# Patient Record
Sex: Male | Born: 1950 | Race: White | Hispanic: No | Marital: Married | Smoking: Never smoker
Health system: Southern US, Community
[De-identification: ages and names within clinical notes are randomized; demographics above are authoritative.]

## PROBLEM LIST (undated history)

## (undated) DIAGNOSIS — I10 Essential (primary) hypertension: Secondary | ICD-10-CM

---

## 2015-03-07 ENCOUNTER — Ambulatory Visit
Admission: EM | Admit: 2015-03-07 | Discharge: 2015-03-07 | Disposition: A | Discharge status: Home / Self Care | Payer: BLUE CROSS/BLUE SHIELD | Attending: Family Medicine | Admitting: Family Medicine

## 2015-03-07 DIAGNOSIS — L237 Allergic contact dermatitis due to plants, except food: Secondary | ICD-10-CM | POA: Diagnosis not present

## 2015-03-07 HISTORY — DX: Essential (primary) hypertension: I10

## 2015-03-07 MED ORDER — PREDNISONE 10 MG PO TABS: ORAL_TABLET | ORAL | Status: DC

## 2015-03-07 MED ORDER — TRIAMCINOLONE ACETONIDE 0.1 % EX CREA: 1.0000 "application " | TOPICAL_CREAM | Freq: Two times a day (BID) | CUTANEOUS | Status: DC

## 2015-03-07 NOTE — ED Provider Notes (Signed)
CSN: [id number]     Arrival date & time 03/07/15  1701 History   First MD Initiated Contact with Patient 03/07/15 1803     Chief Complaint  Patient presents with  . Rash   (Consider location/radiation/quality/duration/timing/severity/associated sxs/prior Treatment) HPI   This is a 64 year old male who was mowing his grass on Thursday. He states that he knows he has poison oak that is low hanging in order to get to the grass he gets in close contact with the line. On Sunday he developed a rash with small vesicles affecting his arms and one spot on his lower back. The rash is very itchy and is in patches. There are excoriations present. 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  Past Medical History  Diagnosis Date  . Hypertension    History reviewed. No pertinent past surgical history. History reviewed. No pertinent family history. History  Substance Use Topics  . Smoking status: Never Smoker   . Smokeless tobacco: Not on file  . Alcohol Use: Yes    Review of Systems  Skin: Positive for rash.  All other systems reviewed and are negative.   Allergies  Penicillins  Home Medications   Prior to Admission medications   Medication Sig Start Date End Date Taking? Authorizing Provider  lisinopril (PRINIVIL,ZESTRIL) 10 MG tablet Take 10 mg by mouth daily.   Yes Historical Provider, MD  predniSONE (DELTASONE) 10 MG tablet Take 60 mg for 3 days; then 40 mg for 3 days ; then 20 mg for 3 days ;and finally 10 mg for 3 days 03/07/15   Lutricia Feil, PA-C  triamcinolone cream (KENALOG) 0.1 % Apply 1 application topically 2 (two) times daily. 03/07/15   Chrissie Noa Tayona Sarnowski, PA-C   BP 133/99 mmHg  Pulse 66  Temp(Src) 97.7 F (36.5 C)  Resp 16  Ht 6' (1.829 m)  Wt 220 lb (99.791 kg)  BMI 29.83 kg/m2  SpO2 100% Physical Exam  Constitutional: He is oriented to person, place, and time. He appears well-developed and well-nourished.  HENT:  Head: Normocephalic and atraumatic.  Eyes:  Pupils are equal, round, and reactive to light.  Musculoskeletal: Normal range of motion.  Neurological: He is alert and oriented to person, place, and time. He has normal reflexes.  Skin: Skin is warm and dry. Rash noted.  Examination of his skin shows the upper arms to have several areas of erythematous confluent maculopapular rash with small vesicles present. This covers mostly his extensor surfaces with very little on the volar surfaces. There is one small area at the small of his back that is excoriated and a patch of erythematous ocular papular rash. He has no involvement of his lower extremities or of his face.  Psychiatric: He has a normal mood and affect. His behavior is normal. Judgment and thought content normal.    ED Course  Procedures (including critical care time) Labs Review Labs Reviewed - No data to display  Imaging Review No results found.   MDM   1. Contact dermatitis due to poison oak    New Prescriptions   PREDNISONE (DELTASONE) 10 MG TABLET    Take 60 mg for 3 days; then 40 mg for 3 days ; then 20 mg for 3 days ;and finally 10 mg for 3 days   TRIAMCINOLONE CREAM (KENALOG) 0.1 %    Apply 1 application topically 2 (two) times daily.   Plan: 1. Test/x-ray results and diagnosis reviewed with patient 2. rx as  per orders; risks, benefits, potential side effects reviewed with patient 3. Recommend supportive treatment with medication 4. F/u prn if symptoms worsen or don't improve      Lutricia FeilWilliam P Marchello Rothgeb, PA-C 03/07/15 1840

## 2015-03-07 NOTE — ED Notes (Signed)
Pt states "rash on left arm, lower back, and today noticed between my right fingers. I think it may be poison oak."

## 2015-03-07 NOTE — Discharge Instructions (Signed)

## 2015-07-13 ENCOUNTER — Emergency Department: Payer: BLUE CROSS/BLUE SHIELD

## 2015-07-13 ENCOUNTER — Emergency Department
Admission: EM | Admit: 2015-07-13 | Discharge: 2015-07-13 | Disposition: A | Discharge status: Home / Self Care | Payer: BLUE CROSS/BLUE SHIELD | Attending: Student | Admitting: Student

## 2015-07-13 ENCOUNTER — Encounter: Payer: Self-pay | Admitting: Emergency Medicine

## 2015-07-13 DIAGNOSIS — Z880 Allergy status to penicillin: Secondary | ICD-10-CM | POA: Insufficient documentation

## 2015-07-13 DIAGNOSIS — F1410 Cocaine abuse, uncomplicated: Secondary | ICD-10-CM | POA: Insufficient documentation

## 2015-07-13 DIAGNOSIS — I10 Essential (primary) hypertension: Secondary | ICD-10-CM | POA: Insufficient documentation

## 2015-07-13 LAB — COMPREHENSIVE METABOLIC PANEL
ALT: 26 U/L (ref 17–63)
AST: 29 U/L (ref 15–41)
Albumin: 4.5 g/dL (ref 3.5–5.0)
Alkaline Phosphatase: 43 U/L (ref 38–126)
Anion gap: 11 (ref 5–15)
BUN: 11 mg/dL (ref 6–20)
CHLORIDE: 100 mmol/L — AB (ref 101–111)
CO2: 22 mmol/L (ref 22–32)
CREATININE: 0.91 mg/dL (ref 0.61–1.24)
Calcium: 9.4 mg/dL (ref 8.9–10.3)
GFR calc non Af Amer: >60 mL/min (ref >60)
GLUCOSE: 152 mg/dL — AB (ref 65–99)
Potassium: 4 mmol/L (ref 3.5–5.1)
SODIUM: 133 mmol/L — AB (ref 135–145)
Total Bilirubin: 0.4 mg/dL (ref 0.3–1.2)
Total Protein: 7.3 g/dL (ref 6.5–8.1)

## 2015-07-13 LAB — CBC
HEMATOCRIT: 42.5 % (ref 40.0–52.0)
HEMOGLOBIN: 14.7 g/dL (ref 13.0–18.0)
MCH: 31.5 pg (ref 26.0–34.0)
MCHC: 34.7 g/dL (ref 32.0–36.0)
MCV: 90.9 fL (ref 80.0–100.0)
Platelets: 189 10*3/uL (ref 150–440)
RBC: 4.67 MIL/uL (ref 4.40–5.90)
RDW: 12.6 % (ref 11.5–14.5)
WBC: 8 10*3/uL (ref 3.8–10.6)

## 2015-07-13 LAB — URINE DRUG SCREEN, QUALITATIVE (ARMC ONLY)
Amphetamines, Ur Screen: NOT DETECTED
Barbiturates, Ur Screen: NOT DETECTED
Benzodiazepine, Ur Scrn: NOT DETECTED
Cannabinoid 50 Ng, Ur ~~LOC~~: NOT DETECTED
Cocaine Metabolite,Ur ~~LOC~~: POSITIVE — AB
MDMA (ECSTASY) UR SCREEN: NOT DETECTED
Methadone Scn, Ur: NOT DETECTED
OPIATE, UR SCREEN: NOT DETECTED
Phencyclidine (PCP) Ur S: NOT DETECTED
TRICYCLIC, UR SCREEN: NOT DETECTED

## 2015-07-13 LAB — TROPONIN I
Troponin I: <0.03 ng/mL (ref <0.031)
Troponin I: <0.03 ng/mL (ref <0.031)

## 2015-07-13 LAB — ETHANOL: Alcohol, Ethyl (B): 5 mg/dL — ABNORMAL HIGH (ref <5)

## 2015-07-13 MED ORDER — DIAZEPAM 5 MG PO TABS
5.0000 mg | ORAL_TABLET | Freq: Once | ORAL | Status: AC
  Administered 2015-07-13: 5 mg via ORAL
  Filled 2015-07-13: qty 1

## 2015-07-13 MED ORDER — SODIUM CHLORIDE 0.9 % IV SOLN
Freq: Once | INTRAVENOUS | Status: AC
  Administered 2015-07-13: 21:00:00 via INTRAVENOUS

## 2015-07-13 MED ORDER — DIAZEPAM 5 MG/ML IJ SOLN
5.0000 mg | Freq: Once | INTRAMUSCULAR | Status: AC
  Administered 2015-07-13: 5 mg via INTRAVENOUS
  Filled 2015-07-13: qty 2

## 2015-07-13 NOTE — ED Provider Notes (Addendum)
[facility] Emergency Department Provider Note     Time seen: ----------------------------------------- 8:42 PM on 07/13/2015 -----------------------------------------    I have reviewed the triage vital signs and the nursing notes.   HISTORY  Chief Complaint Hypertension and Ingestion    HPI Eric Holland is a 64 y.o. male who presents ER from home. Patient states he tried what he thought was cocaine for the first time today around [birth year]1929. Patient states about an hour and a half ago he started being flushed and having head pressure. He still complaining of same, he denies shortness of breath or chest pain. Patient states he does want feel this way.   Past Medical History  Diagnosis Date  . Hypertension     There are no active problems to display for this patient.   No past surgical history on file.  Allergies Penicillins  Social History Social History  Substance Use Topics  . Smoking status: Never Smoker   . Smokeless tobacco: Not on file  . Alcohol Use: Yes    Review of Systems Constitutional: Negative for fever. Eyes: Negative for visual changes. ENT: Negative for sore throat. Cardiovascular: Negative for chest pain. Respiratory: Negative for shortness of breath. Gastrointestinal: Negative for abdominal pain, vomiting and diarrhea. Genitourinary: Negative for dysuria. Musculoskeletal: Negative for back pain. Skin: Positive for skin flushing Neurological: Positive for head pressure, negative for focal weakness or numbness.  10-point ROS otherwise negative.  ____________________________________________   PHYSICAL EXAM:  VITAL SIGNS: ED Triage Vitals  Enc Vitals Group     BP --      Pulse --      Resp --      Temp --      Temp src --      SpO2 --      Weight --      Height --      Head Cir --      Peak Flow --      Pain Score --      Pain Loc --      Pain Edu? --      Excl. in GC? --     Constitutional:  Alert and oriented. Anxious, flushed appearance Eyes: Conjunctivae are normal. PERRL. Normal extraocular movements. ENT   Head: Normocephalic and atraumatic.   Nose: No congestion/rhinnorhea.   Mouth/Throat: Mucous membranes are moist.   Neck: No stridor. Cardiovascular: Normal rate, regular rhythm. Normal and symmetric distal pulses are present in all extremities. No murmurs, rubs, or gallops. Respiratory: Mild tachypnea, Breath sounds are clear and equal bilaterally. No wheezes/rales/rhonchi. Gastrointestinal: Soft and nontender. No distention. No abdominal bruits.  Musculoskeletal: Nontender with normal range of motion in all extremities. No joint effusions.  No lower extremity tenderness nor edema. Neurologic:  Normal speech and language. No gross focal neurologic deficits are appreciated. Speech is normal. No gait instability. Skin:  Skin erythema across his chest neck and face. No hives or rash. Psychiatric: Mood and affect are normal. Speech and behavior are normal. Patient exhibits appropriate insight and judgment. ____________________________________________  EKG: Interpreted by me. Sinus tachycardia with rate of 109 bpm, incomplete right bundle branch block, normal axis. No evidence of acute infarction.  ____________________________________________  ED COURSE:  Pertinent labs & imaging results that were available during my care of the patient were reviewed by me and considered in my medical decision making (see chart for details). Patient receive IV fluids, Valium. We'll check labs and drug screen. ____________________________________________    LABS (pertinent  positives/negatives)  Labs Reviewed  COMPREHENSIVE METABOLIC PANEL - Abnormal; Notable for the following:    Sodium 133 (*)    Chloride 100 (*)    Glucose, Bld 152 (*)    All other components within normal limits  ETHANOL - Abnormal; Notable for the following:    Alcohol, Ethyl (B) 5 (*)    All other  components within normal limits  URINE DRUG SCREEN, QUALITATIVE ([facility] ONLY) - Abnormal; Notable for the following:    Cocaine Metabolite,Ur [place] POSITIVE (*)    All other components within normal limits  CBC  TROPONIN I  TROPONIN I    RADIOLOGY Images were viewed by me  Chest x-ray Is unremarkable ____________________________________________  FINAL ASSESSMENT AND PLAN  Cocaine abuse  Plan: Patient with labs and imaging as dictated above. Patient is feeling better after fluids and Valium. His labs to this point been unremarkable. Troponin was checked 2 and was negative. He stable for outpatient follow-up.   Emily Filbert, MD   Emily Filbert, MD 07/13/15 [id number]  Emily Filbert, MD 07/13/15 [phone]

## 2015-07-13 NOTE — ED Notes (Signed)
Ice water provided to patient

## 2015-07-13 NOTE — ED Notes (Signed)
MD at bedside for reeval

## 2015-07-13 NOTE — Discharge Instructions (Signed)
Stimulant Use Disorder-Cocaine  Cocaine is one of a group of powerful drugs called stimulants. Cocaine has medical uses for stopping nosebleeds and for pain control before minor nose or dental surgery. However, cocaine is misused because of the effects that it produces. These effects include:   · A feeling of extreme pleasure.  · Alertness.  · High energy.  Common street names for cocaine include coke, crack, blow, snow, and nose candy. Cocaine is snorted, dissolved in water and injected, or smoked.   Stimulants are addictive because they activate regions of the brain that produce both the pleasurable sensation of "reward" and psychological dependence. Together, these actions account for loss of control and the rapid development of drug dependence. This means you become ill without the drug (withdrawal) and need to keep using it to function.   Stimulant use disorder is use of stimulants that disrupts your daily life. It disrupts relationships with family and friends and how you do your job. Cocaine increases your blood pressure and heart rate. It can cause a heart attack or stroke. Cocaine can also cause death from irregular heart rate or seizures.  SYMPTOMS  Symptoms of stimulant use disorder with cocaine include:  · Use of cocaine in larger amounts or over a longer period of time than intended.  · Unsuccessful attempts to cut down or control cocaine use.  · A lot of time spent obtaining, using, or recovering from the effects of cocaine.  · A strong desire or urge to use cocaine (craving).  · Continued use of cocaine in spite of major problems at work, school, or home because of use.  · Continued use of cocaine in spite of relationship problems because of use.  · Giving up or cutting down on important life activities because of cocaine use.  · Use of cocaine over and over in situations when it is physically hazardous, such as driving a car.  · Continued use of cocaine in spite of a physical problem that is likely  related to use. Physical problems can include:  ¨ Malnutrition.  ¨ Nosebleeds.  ¨ Chest pain.  ¨ High blood pressure.  ¨ A hole that develops between the part of your nose that separates your nostrils (perforated nasal septum).  ¨ Lung and kidney damage.  · Continued use of cocaine in spite of a mental problem that is likely related to use. Mental problems can include:  ¨ Schizophrenia-like symptoms.  ¨ Depression.  ¨ Bipolar mood swings.  ¨ Anxiety.  ¨ Sleep problems.  · Need to use more and more cocaine to get the same effect, or lessened effect over time with use of the same amount of cocaine (tolerance).  · Having withdrawal symptoms when cocaine use is stopped, or using cocaine to reduce or avoid withdrawal symptoms. Withdrawal symptoms include:  ¨ Depressed or irritable mood.  ¨ Low energy or restlessness.  ¨ Bad dreams.  ¨ Poor or excessive sleep.  ¨ Increased appetite.  DIAGNOSIS  Stimulant use disorder is diagnosed by your health care provider. You may be asked questions about your cocaine use and how it affects your life. A physical exam may be done. A drug screen may be ordered. You may be referred to a mental health professional. The diagnosis of stimulant use disorder requires at least two symptoms within 12 months. The type of stimulant use disorder depends on the number of signs and symptoms you have. The type may be:  · Mild. Two or three signs and symptoms.  ·   Moderate. Four or five signs and symptoms.  · Severe. Six or more signs and symptoms.  TREATMENT  Treatment for stimulant use disorder is usually provided by mental health professionals with training in substance use disorders. The following options are available:  · Counseling or talk therapy. Talk therapy addresses the reasons you use cocaine and ways to keep you from using again. Goals of talk therapy include:  ¨ Identifying and avoiding triggers for use.  ¨ Handling cravings.  ¨ Replacing use with healthy activities.  · Support groups.  Support groups provide emotional support, advice, and guidance.  · Medicine. Certain medicines may decrease cocaine cravings or withdrawal symptoms.  HOME CARE INSTRUCTIONS  · Take medicines only as directed by your health care provider.  · Identify the people and activities that trigger your cocaine use and avoid them.  · Keep all follow-up visits as directed by your health care provider.  SEEK MEDICAL CARE IF:  · Your symptoms get worse or you relapse.  · You are not able to take medicines as directed.  SEEK IMMEDIATE MEDICAL CARE IF:  · You have serious thoughts about hurting yourself or others.  · You have a seizure, chest pain, sudden weakness, or loss of speech or vision.  FOR MORE INFORMATION  · National Institute on Drug Abuse: www.drugabuse.gov  · Substance Abuse and Mental Health Services Administration: www.samhsa.gov     This information is not intended to replace advice given to you by your health care provider. Make sure you discuss any questions you have with your health care provider.     Document Released: 09/19/2000 Document Revised: 10/13/2014 Document Reviewed: 10/05/2013  Elsevier Interactive Patient Education ©2016 [employer]

## 2015-07-13 NOTE — ED Notes (Signed)
Pt arrives via EMS from home. Pt states, "I tried what I think was cocaine for the first time today around 4 or 4:30. About an hour and a half ago I started feeling flushed and had head pressure." Pt c/o "feeling flushed and head pressure" at this time. NAD noted. RR even and nonlabored. Pt AAOx4. Denies SOB or chest pain.

## 2017-10-16 ENCOUNTER — Other Ambulatory Visit: Payer: Self-pay

## 2017-10-16 ENCOUNTER — Encounter: Payer: Self-pay | Admitting: Emergency Medicine

## 2017-10-16 ENCOUNTER — Ambulatory Visit
Admission: EM | Admit: 2017-10-16 | Discharge: 2017-10-16 | Disposition: A | Discharge status: Home / Self Care | Payer: BLUE CROSS/BLUE SHIELD | Attending: Family Medicine | Admitting: Family Medicine

## 2017-10-16 DIAGNOSIS — J069 Acute upper respiratory infection, unspecified: Secondary | ICD-10-CM

## 2017-10-16 DIAGNOSIS — R05 Cough: Secondary | ICD-10-CM

## 2017-10-16 MED ORDER — BENZONATATE 200 MG PO CAPS: ORAL_CAPSULE | ORAL | 0 refills | Status: DC

## 2017-10-16 MED ORDER — HYDROCOD POLST-CPM POLST ER 10-8 MG/5ML PO SUER: 5.0000 mL | Freq: Two times a day (BID) | ORAL | 0 refills | Status: DC

## 2017-10-16 MED ORDER — FLUTICASONE PROPIONATE 50 MCG/ACT NA SUSP: 2.0000 | Freq: Every day | NASAL | 0 refills | Status: DC

## 2017-10-16 NOTE — ED Provider Notes (Signed)
[facility]    CSN: [id number] Arrival date & time: 10/16/17  0844     History   Chief Complaint Chief Complaint  Patient presents with  . Cough    HPI Eric Holland is a 67 y.o. male.   HPI 67 year old male who presents with cough chest congestion sinus congestion and runny nose since Monday 4 days prior to this visit.  He is afebrile.  He denies any fevers.  He has had upper nasal congestion that worsened with recumbency and then developed a cough which is nonproductive.  He denies any shortness of breath.  O2 sats are 99% on room air.  He was feeling better yesterday and thought that he was over the illness but only returned today worse.      Past Medical History:  Diagnosis Date  . Hypertension     There are no active problems to display for this patient.   History reviewed. No pertinent surgical history.     Home Medications    Prior to Admission medications   Medication Sig Start Date End Date Taking? Authorizing Provider  aspirin EC 81 MG tablet Take 81 mg by mouth daily.   Yes [provider]  lisinopril (PRINIVIL,ZESTRIL) 10 MG tablet Take 10 mg by mouth daily.   Yes [provider]  benzonatate (TESSALON) 200 MG capsule Take one cap TID PRN cough 10/16/17   Lutricia Feil, PA-C  chlorpheniramine-HYDROcodone [facility]) 10-8 MG/5ML SUER Take 5 mLs by mouth 2 (two) times daily. 10/16/17   Lutricia Feil, PA-C  fluticasone (FLONASE) 50 MCG/ACT nasal spray Place 2 sprays into both nostrils daily. 10/16/17   Lutricia Feil, PA-C    Family History Family History  Problem Relation Age of Onset  . Heart failure Father     Social History Social History   Tobacco Use  . Smoking status: Never Smoker  . Smokeless tobacco: Never Used  Substance Use Topics  . Alcohol use: Yes    Comment: 3-4 times/week  . Drug use: No     Allergies   Penicillins   Review of Systems Review of Systems    Constitutional: Positive for activity change. Negative for chills, fatigue and fever.  HENT: Positive for congestion, postnasal drip, rhinorrhea and sinus pressure.   Respiratory: Positive for cough.   All other systems reviewed and are negative.    Physical Exam Triage Vital Signs ED Triage Vitals  Enc Vitals Group     BP 10/16/17 0900 (!) 150/99     Pulse Rate 10/16/17 0900 71     Resp 10/16/17 0900 16     Temp 10/16/17 0900 98.1 F (36.7 C)     Temp Source 10/16/17 0900 Oral     SpO2 10/16/17 0900 99 %     Weight 10/16/17 0856 225 lb (102.1 kg)     Height 10/16/17 0856 6' (1.829 m)     Head Circumference --      Peak Flow --      Pain Score 10/16/17 0857 3     Pain Loc --      Pain Edu? --      Excl. in GC? --    No data found.  Updated Vital Signs BP (!) 150/99 (BP Location: Left Arm)   Pulse 71   Temp 98.1 F (36.7 C) (Oral)   Resp 16   Ht 6' (1.829 m)   Wt 225 lb (102.1 kg)   SpO2 99%  BMI 30.52 kg/m   Visual Acuity Right Eye Distance:   Left Eye Distance:   Bilateral Distance:    Right Eye Near:   Left Eye Near:    Bilateral Near:     Physical Exam  Constitutional: He is oriented to person, place, and time. He appears well-developed and well-nourished. No distress.  HENT:  Head: Normocephalic.  Right Ear: External ear normal.  Left Ear: External ear normal.  Nose: Nose normal.  Mouth/Throat: Oropharynx is clear and moist. No oropharyngeal exudate.  Eyes: Pupils are equal, round, and reactive to light. Right eye exhibits no discharge. Left eye exhibits no discharge.  Neck: Normal range of motion.  Pulmonary/Chest: Effort normal and breath sounds normal.  Musculoskeletal: Normal range of motion.  Lymphadenopathy:    He has no cervical adenopathy.  Neurological: He is alert and oriented to person, place, and time.  Skin: Skin is warm and dry. He is not diaphoretic.  Psychiatric: He has a normal mood and affect. His behavior is normal. Judgment  and thought content normal.  Nursing note and vitals reviewed.    UC Treatments / Results  Labs (all labs ordered are listed, but only abnormal results are displayed) Labs Reviewed - No data to display  EKG  EKG Interpretation None       Radiology No results found.  Procedures Procedures (including critical care time)  Medications Ordered in UC Medications - No data to display   Initial Impression / Assessment and Plan / UC Course  I have reviewed the triage vital signs and the nursing notes.  Pertinent labs & imaging results that were available during my care of the patient were reviewed by me and considered in my medical decision making (see chart for details).     Plan: 1. Test/x-ray results and diagnosis reviewed with patient 2. rx as per orders; risks, benefits, potential side effects reviewed with patient 3. Recommend supportive treatment with rest and fluids.  May use Mucinex to help loosen your cough.  If you are not improving in a week return to our clinic or follow-up with your primary care physician for further evaluation 4. F/u prn if symptoms worsen or don't improve   Final Clinical Impressions(s) / UC Diagnoses   Final diagnoses:  Upper respiratory tract infection, unspecified type    ED Discharge Orders        Ordered    fluticasone (FLONASE) 50 MCG/ACT nasal spray  Daily     10/16/17 0918    benzonatate (TESSALON) 200 MG capsule     10/16/17 0918    chlorpheniramine-HYDROcodone (TUSSIONEX PENNKINETIC ER) 10-8 MG/5ML SUER  2 times daily     10/16/17 0918       Controlled Substance Prescriptions [place] Controlled Substance Registry consulted? Not Applicable   Lutricia FeilRoemer, Mikena Masoner P, PA-C 10/16/17 [id number]

## 2017-10-16 NOTE — ED Triage Notes (Signed)
Patient c/o cough, chest congestion, sinus congestion and runny nose since Monday.  Patient denies fevers.

## 2017-10-19 ENCOUNTER — Telehealth: Payer: Self-pay | Admitting: Emergency Medicine

## 2017-10-19 NOTE — Telephone Encounter (Signed)
Called to follow up with patient after his recent visit. Patient states he is improving and able to cough some mucous up.

## 2018-06-12 ENCOUNTER — Encounter: Payer: Self-pay | Admitting: Emergency Medicine

## 2018-06-12 ENCOUNTER — Emergency Department: Payer: BLUE CROSS/BLUE SHIELD

## 2018-06-12 ENCOUNTER — Emergency Department
Admission: EM | Admit: 2018-06-12 | Discharge: 2018-06-12 | Disposition: A | Discharge status: Home / Self Care | Payer: BLUE CROSS/BLUE SHIELD | Attending: Emergency Medicine | Admitting: Emergency Medicine

## 2018-06-12 ENCOUNTER — Other Ambulatory Visit: Payer: Self-pay

## 2018-06-12 DIAGNOSIS — I10 Essential (primary) hypertension: Secondary | ICD-10-CM | POA: Insufficient documentation

## 2018-06-12 DIAGNOSIS — R079 Chest pain, unspecified: Secondary | ICD-10-CM | POA: Diagnosis present

## 2018-06-12 DIAGNOSIS — Z7982 Long term (current) use of aspirin: Secondary | ICD-10-CM | POA: Diagnosis not present

## 2018-06-12 DIAGNOSIS — Z79899 Other long term (current) drug therapy: Secondary | ICD-10-CM | POA: Insufficient documentation

## 2018-06-12 DIAGNOSIS — K2920 Alcoholic gastritis without bleeding: Secondary | ICD-10-CM | POA: Diagnosis not present

## 2018-06-12 LAB — COMPREHENSIVE METABOLIC PANEL
ALT: 26 U/L (ref 0–44)
ANION GAP: 9 (ref 5–15)
AST: 23 U/L (ref 15–41)
Albumin: 4.1 g/dL (ref 3.5–5.0)
Alkaline Phosphatase: 39 U/L (ref 38–126)
BUN: 18 mg/dL (ref 8–23)
CHLORIDE: 102 mmol/L (ref 98–111)
CO2: 24 mmol/L (ref 22–32)
Calcium: 8.8 mg/dL — ABNORMAL LOW (ref 8.9–10.3)
Creatinine, Ser: 0.89 mg/dL (ref 0.61–1.24)
Glucose, Bld: 132 mg/dL — ABNORMAL HIGH (ref 70–99)
POTASSIUM: 4.4 mmol/L (ref 3.5–5.1)
Sodium: 135 mmol/L (ref 135–145)
Total Bilirubin: 1 mg/dL (ref 0.3–1.2)
Total Protein: 7.1 g/dL (ref 6.5–8.1)

## 2018-06-12 LAB — CBC
HCT: 40.2 % (ref 40.0–52.0)
Hemoglobin: 14.3 g/dL (ref 13.0–18.0)
MCH: 32.6 pg (ref 26.0–34.0)
MCHC: 35.6 g/dL (ref 32.0–36.0)
MCV: 91.4 fL (ref 80.0–100.0)
Platelets: 190 10*3/uL (ref 150–440)
RBC: 4.4 MIL/uL (ref 4.40–5.90)
RDW: 12.6 % (ref 11.5–14.5)
WBC: 6.2 10*3/uL (ref 3.8–10.6)

## 2018-06-12 LAB — TROPONIN I

## 2018-06-12 LAB — LIPASE, BLOOD: LIPASE: 22 U/L (ref 11–51)

## 2018-06-12 MED ORDER — NITROGLYCERIN 0.4 MG SL SUBL
0.4000 mg | SUBLINGUAL_TABLET | SUBLINGUAL | Status: DC | PRN
  Administered 2018-06-12: 0.4 mg via SUBLINGUAL
  Filled 2018-06-12: qty 1

## 2018-06-12 MED ORDER — FAMOTIDINE 20 MG PO TABS: 20.0000 mg | ORAL_TABLET | Freq: Two times a day (BID) | ORAL | 1 refills | Status: AC

## 2018-06-12 MED ORDER — FAMOTIDINE IN NACL 20-0.9 MG/50ML-% IV SOLN
20.0000 mg | Freq: Once | INTRAVENOUS | Status: AC
  Administered 2018-06-12: 20 mg via INTRAVENOUS
  Filled 2018-06-12: qty 50

## 2018-06-12 MED ORDER — ONDANSETRON HCL 4 MG/2ML IJ SOLN
4.0000 mg | Freq: Once | INTRAMUSCULAR | Status: AC
  Administered 2018-06-12: 4 mg via INTRAVENOUS
  Filled 2018-06-12: qty 2

## 2018-06-12 MED ORDER — SODIUM CHLORIDE 0.9 % IV BOLUS
1000.0000 mL | Freq: Once | INTRAVENOUS | Status: AC
  Administered 2018-06-12: 1000 mL via INTRAVENOUS

## 2018-06-12 MED ORDER — ASPIRIN 81 MG PO CHEW
324.0000 mg | CHEWABLE_TABLET | Freq: Once | ORAL | Status: AC
  Administered 2018-06-12: 324 mg via ORAL
  Filled 2018-06-12: qty 4

## 2018-06-12 NOTE — ED Triage Notes (Signed)
Pt reports woke up in middle of night with what he thought was indigestion.  Hx of indigestion but normally resolves when "passing gas".  Pt has a discomfort or indigestion like feeling in chest that will not go away. Has been feeling lightheaded and dizzy.  Has been nauseated as well.

## 2018-06-12 NOTE — Discharge Instructions (Addendum)

## 2018-06-12 NOTE — ED Provider Notes (Signed)
[facility] Emergency Department Provider Note  ____________________________________________  Time seen: Approximately 9:09 AM  I have reviewed the triage vital signs and the nursing notes.   HISTORY  Chief Complaint Chest Pain   HPI Eric Holland is a 67 y.o. male with a history of hypertension and GERD who presents for evaluation of chest tightness.  Patient reports that he usually wakes up in the morning feeling mild discomfort in his chest which usually resolves after he passes gas.  Today the symptoms have been constant since 3 AM.  He is complaining of tightness in the center of his chest which has been constant and mild/moderate in intensity, associated with nausea and lightheadedness.  The tightness does not radiate to his back.  Patient reports having 8 alcoholic beverages last night.  He denies any drug/cocaine use.  No history of smoking.  Patient has family history in father and grandfather heart attacks in their 52s.  Patient denies pleuritic chest pain, personal family history of blood clots, recent travel immobilization, leg pain or swelling or hemoptysis.  Past Medical History:  Diagnosis Date  . Hypertension     There are no active problems to display for this patient.   History reviewed. No pertinent surgical history.  Prior to Admission medications   Medication Sig Start Date End Date Taking? Authorizing Provider  aspirin EC 81 MG tablet Take 81 mg by mouth daily.   Yes [provider]  lisinopril (PRINIVIL,ZESTRIL) 10 MG tablet Take 10 mg by mouth daily.   Yes [provider]  Multiple Vitamin (MULTIVITAMIN WITH MINERALS) TABS tablet Take 1 tablet by mouth daily.   Yes [provider]  famotidine (PEPCID) 20 MG tablet Take 1 tablet (20 mg total) by mouth 2 (two) times daily. 06/12/18 06/12/19  Nita Sickle, MD    Allergies Penicillins  Family History  Problem Relation Age of Onset  . Heart failure  Father     Social History Social History   Tobacco Use  . Smoking status: Never Smoker  . Smokeless tobacco: Never Used  Substance Use Topics  . Alcohol use: Yes    Comment: 3-4 times/week  . Drug use: No    Types: Cocaine    Review of Systems  Constitutional: Negative for fever. + Lightheadedness Eyes: Negative for visual changes. ENT: Negative for sore throat. Neck: No neck pain  Cardiovascular: + chest tightness Respiratory: Negative for shortness of breath. Gastrointestinal: Negative for abdominal pain, vomiting or diarrhea.+ Nausea Genitourinary: Negative for dysuria. Musculoskeletal: Negative for back pain. Skin: Negative for rash. Neurological: Negative for headaches, weakness or numbness. Psych: No SI or HI  ____________________________________________   PHYSICAL EXAM:  VITAL SIGNS: ED Triage Vitals  Enc Vitals Group     BP 06/12/18 0840 (!) 157/105     Pulse Rate 06/12/18 0840 62     Resp 06/12/18 0840 16     Temp 06/12/18 0840 (!) 97.3 F (36.3 C)     Temp Source 06/12/18 0840 Oral     SpO2 06/12/18 0840 98 %     Weight 06/12/18 0834 230 lb (104.3 kg)     Height 06/12/18 0834 6' (1.829 m)     Head Circumference --      Peak Flow --      Pain Score 06/12/18 0833 7     Pain Loc --      Pain Edu? --      Excl. in [place]? --     Constitutional:  Alert and oriented. Well appearing and in no apparent distress. HEENT:      Head: Normocephalic and atraumatic.         Eyes: Conjunctivae are normal. Sclera is non-icteric.       Mouth/Throat: Mucous membranes are moist.       Neck: Supple with no signs of meningismus. Cardiovascular: Regular rate and rhythm. No murmurs, gallops, or rubs. 2+ symmetrical distal pulses are present in all extremities. No JVD. Respiratory: Normal respiratory effort. Lungs are clear to auscultation bilaterally. No wheezes, crackles, or rhonchi.  Gastrointestinal: Soft, tender to palpation epigastric region, and non distended with  positive bowel sounds. No rebound or guarding. Musculoskeletal: Nontender with normal range of motion in all extremities. No edema, cyanosis, or erythema of extremities. Neurologic: Normal speech and language. Face is symmetric. Moving all extremities. No gross focal neurologic deficits are appreciated. Skin: Skin is warm, dry and intact. No rash noted. Psychiatric: Mood and affect are normal. Speech and behavior are normal.  ____________________________________________   LABS (all labs ordered are listed, but only abnormal results are displayed)  Labs Reviewed  COMPREHENSIVE METABOLIC PANEL - Abnormal; Notable for the following components:      Result Value   Glucose, Bld 132 (*)    Calcium 8.8 (*)    All other components within normal limits  CBC  TROPONIN I  LIPASE, BLOOD  TROPONIN I  URINE DRUG SCREEN, QUALITATIVE ([facility] ONLY)   ____________________________________________  EKG  ED ECG REPORT I, Nita Sickle, the attending physician, personally viewed and interpreted this ECG.  08:37 -sinus bradycardia, rate of 59, right bundle branch block, normal QTC, normal axis, T wave inversion lead III, no ST elevations or depressions.  Unchanged from prior from 2016.  08:49 -sinus bradycardia, rate of 52, right bundle branch block, normal QTC, normal axis, T wave flattening in lead III, no ST elevations or depressions.  No significant changes from initial. ____________________________________________  RADIOLOGY  I have personally reviewed the images performed during this visit and I agree with the Radiologist's read.   Interpretation by Radiologist:  Dg Chest 2 View  Result Date: 06/12/2018 CLINICAL DATA:  67 year old male with a history of epigastric pain EXAM: CHEST - 2 VIEW COMPARISON:  07/13/2015 FINDINGS: Cardiomediastinal silhouette unchanged in size and contour. No evidence of central vascular congestion. No pneumothorax or pleural effusion. Low lung volumes.  Linear  interstitial opacities bilaterally. No displaced fracture. IMPRESSION: Chronic lung changes without evidence of acute cardiopulmonary disease Electronically Signed   By: Gilmer Mor D.O.   On: 06/12/2018 09:34      ____________________________________________   PROCEDURES  Procedure(s) performed: None Procedures Critical Care performed:  None ____________________________________________   INITIAL IMPRESSION / ASSESSMENT AND PLAN / ED COURSE   67 y.o. male with a history of hypertension and GERD who presents for evaluation of chest tightness associated with nausea and lightheadedness.  Patient has a history of daily drinking and had 8 alcoholic beverages yesterday.  He is well-appearing and in no distress, slightly elevated blood pressure but other than that his vital signs are within normal limits.  EKG x2 with no ischemic changes.  Differential diagnosis including ACS versus pancreatitis versus gastritis versus GERD versus peptic ulcer disease.  We will give nitro, fluids, Zofran, Pepcid, and aspirin.  We will check CBC, CMP, lipase, troponin and chest x-ray.  Will monitor patient carefully on telemetry. Clinical Course as of Jun 12 1350  Sat Jun 12, 2018  1348 Patient's pain resolved after  IV Pepcid.  Troponin x2 is negative.  Labs including CMP, lipase, CBC are all within normal limits.  Will give Pepcid for home.  Recommended follow-up with primary care doctor.  Discussed reduction alcohol consumption and the risks associated with daily alcohol use.  Recommended return to the emergency room for new or worsening chest pain, shortness of breath or abdominal pain.   [CV]    Clinical Course User Index [CV] Don Perking [place], MD     As part of my medical decision making, I reviewed the following data within the electronic MEDICAL RECORD NUMBER Nursing notes reviewed and incorporated, Labs reviewed , EKG interpreted , Old EKG reviewed, Old chart reviewed, Radiograph reviewed , Notes from  prior ED visits and [place] Controlled Substance Database    Pertinent labs & imaging results that were available during my care of the patient were reviewed by me and considered in my medical decision making (see chart for details).    ____________________________________________   FINAL CLINICAL IMPRESSION(S) / ED DIAGNOSES  Final diagnoses:  Chest pain, unspecified type  Acute alcoholic gastritis without hemorrhage      NEW MEDICATIONS STARTED DURING THIS VISIT:  ED Discharge Orders         Ordered    famotidine (PEPCID) 20 MG tablet  2 times daily     06/12/18 1351           Note:  This document was prepared using Dragon voice recognition software and may include unintentional dictation errors.    Don Perking, [place], MD 06/12/18 1351

## 2018-06-12 NOTE — ED Notes (Signed)
Patient transported to X-ray 

## 2018-06-12 NOTE — ED Notes (Addendum)
Pt c/o chest pain - describes pain as "indigestion and gas that wants to come out" - pt reports having excessive gas usually with indigestion but this am it increased in intensity - pt having mid epigastric pain with palpation - pt c/o weakness and "feeling like he is going to pass out - denies vomiting but c/o nausea

## 2019-07-20 IMAGING — CR DG CHEST 2V
2 series · 2 of 2 positions shown · non-contrast
Comparison: 07/13/2015

CLINICAL DATA: 66-year-old male with a history of epigastric pain

EXAM:
CHEST - 2 VIEW

[chest pa]
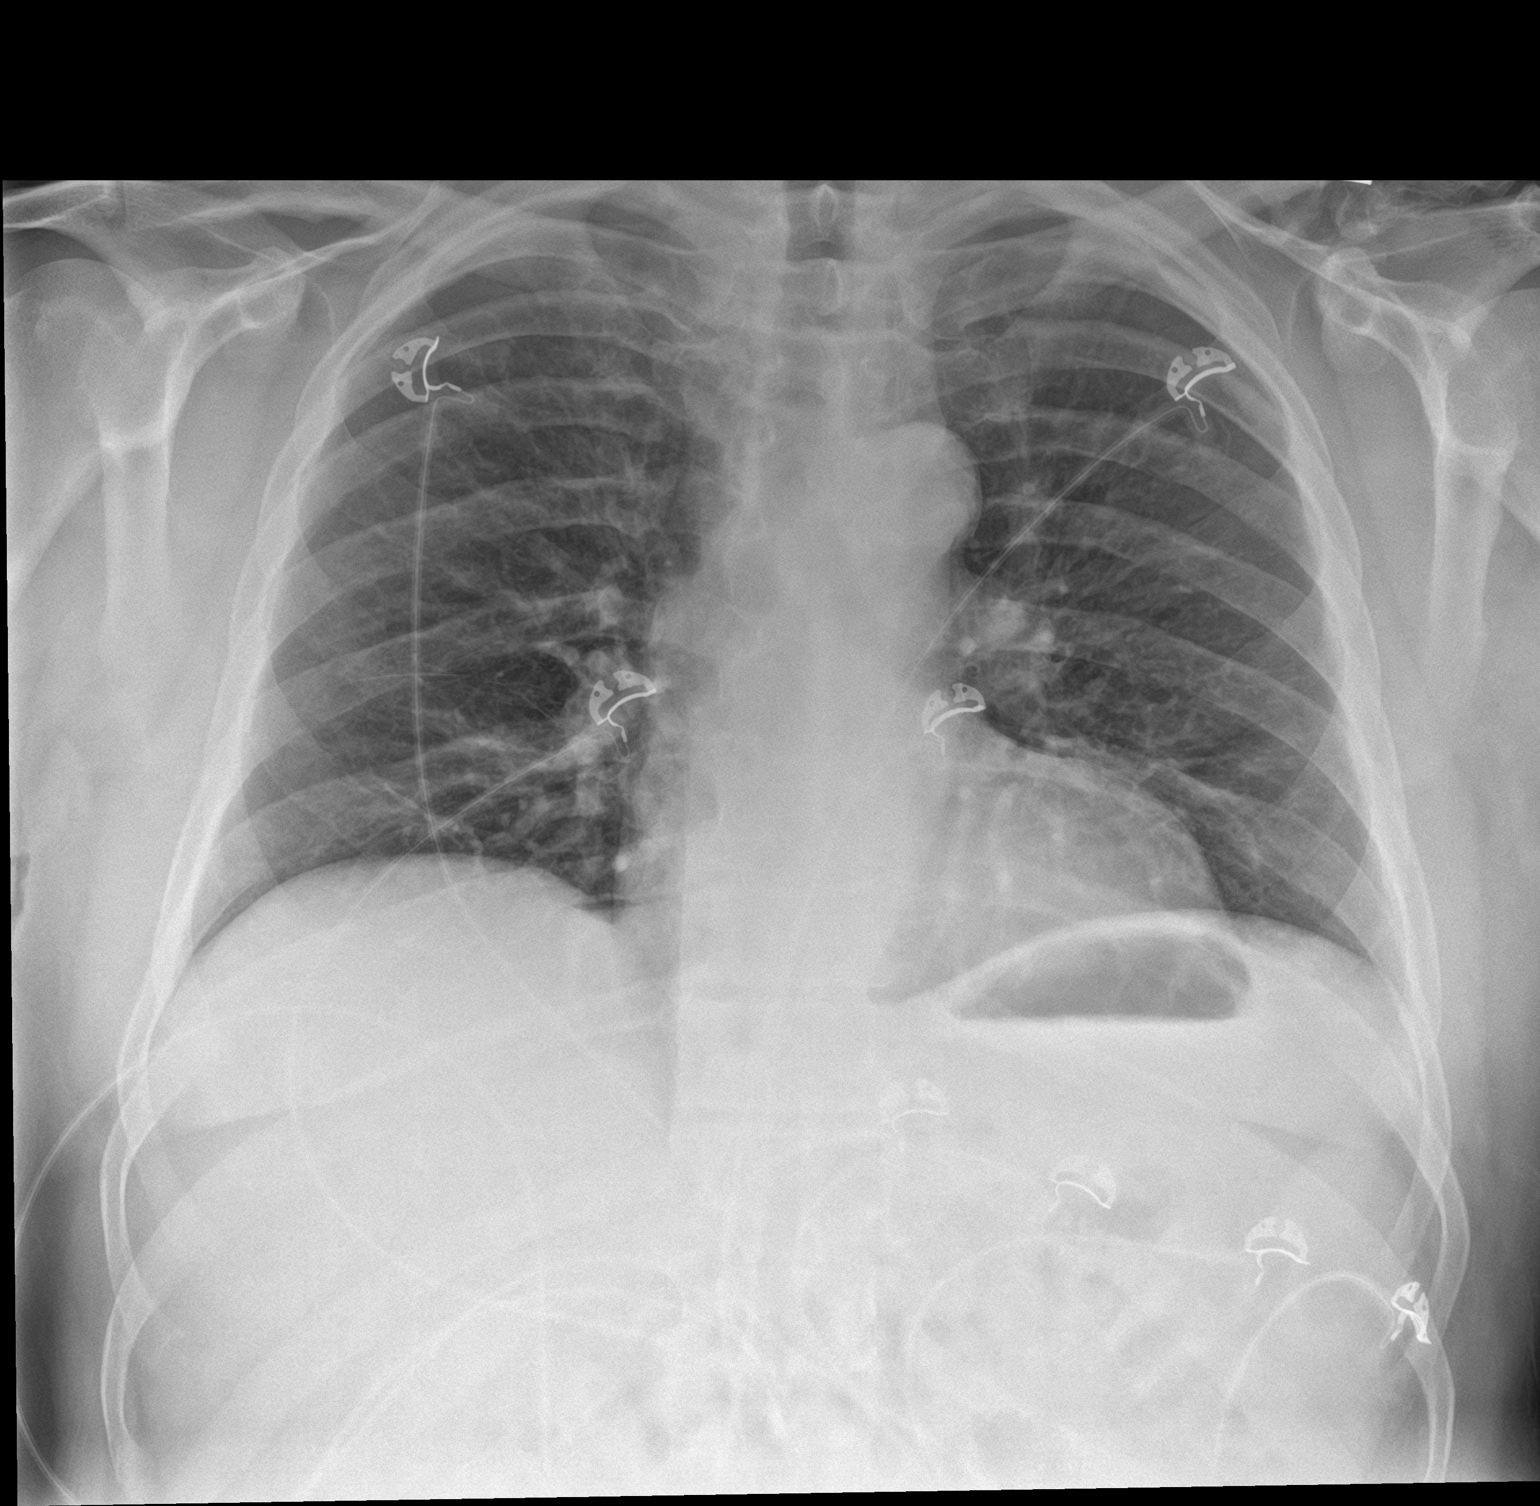

[chest lat]
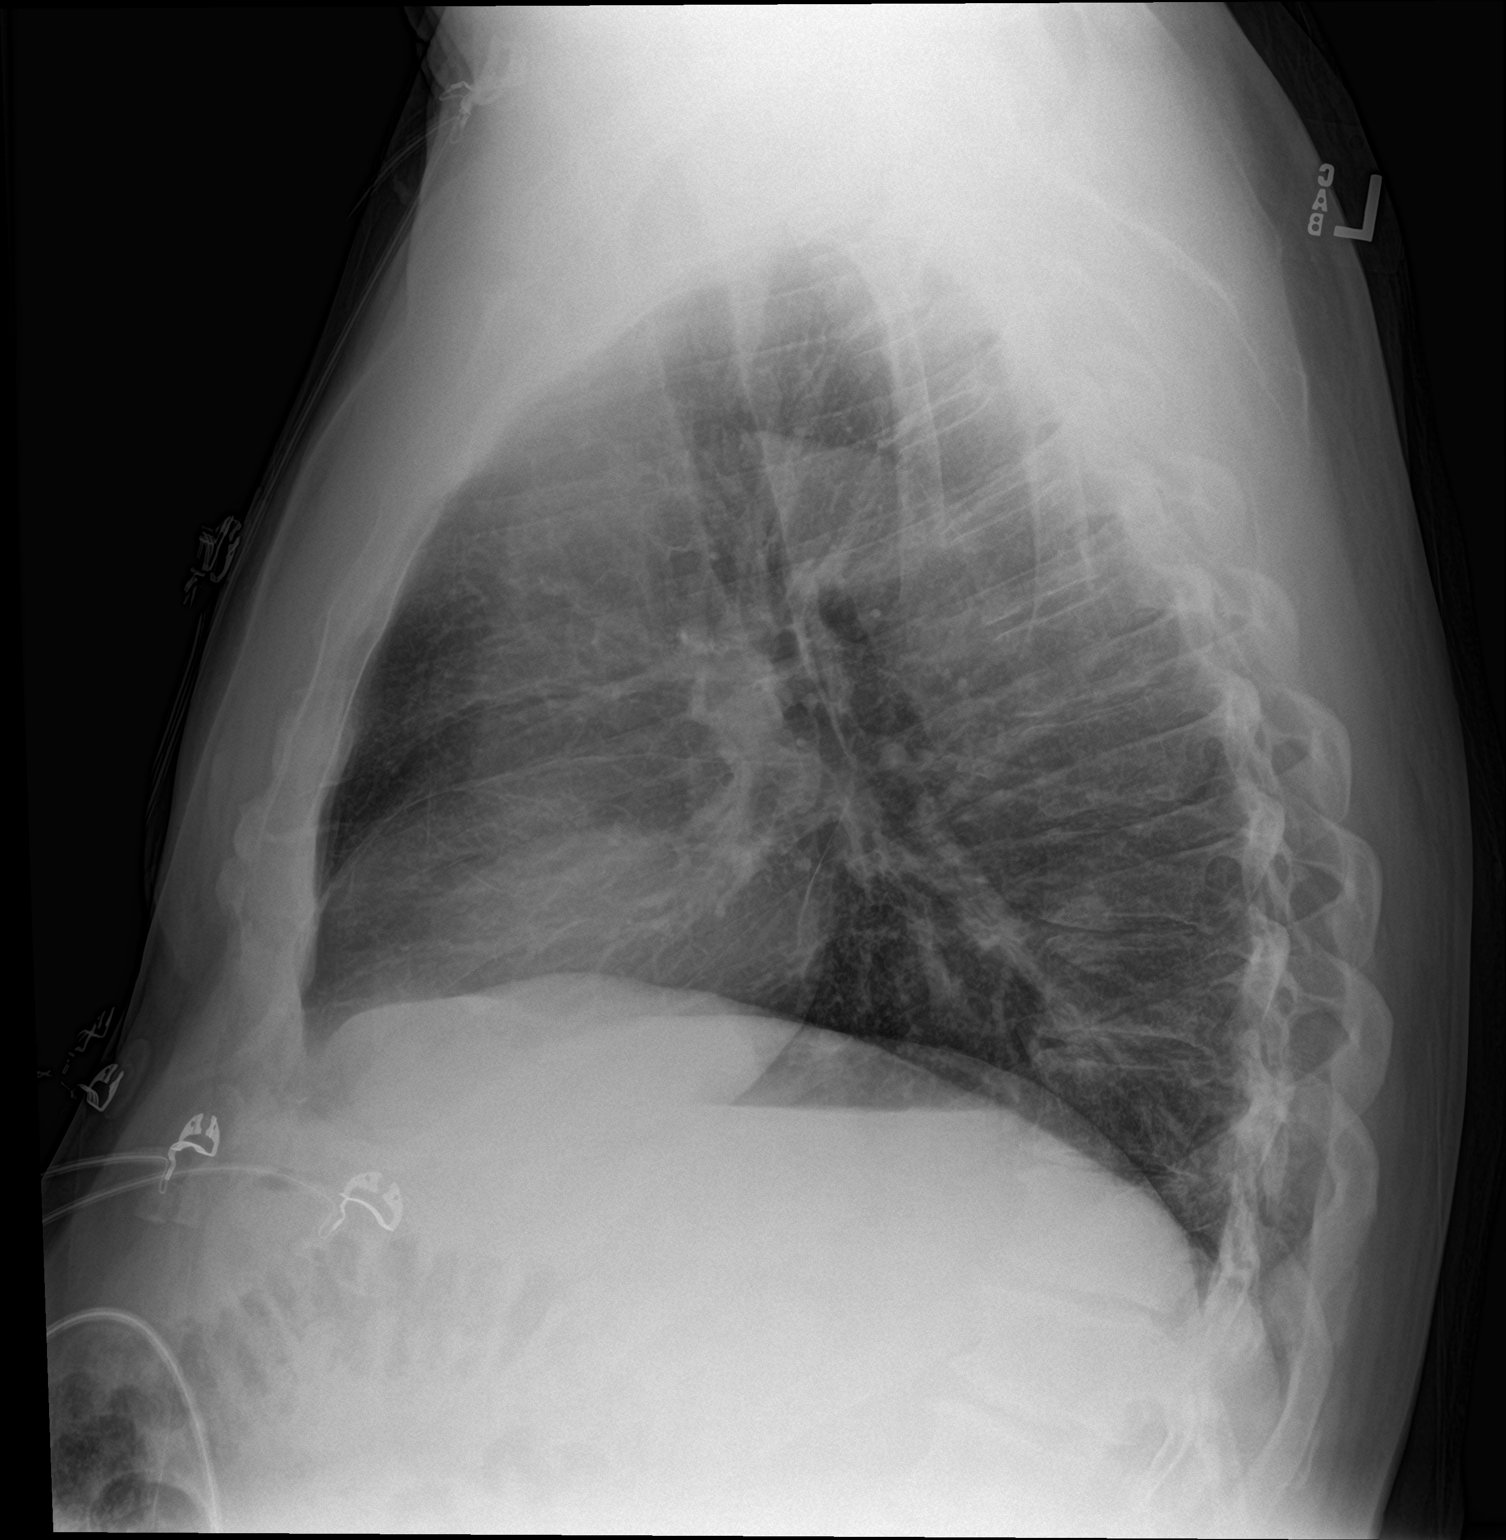

[2 of 2 positions shown; findings below may reference images not displayed]

FINDINGS: Cardiomediastinal silhouette unchanged in size and contour. No
evidence of central vascular congestion. No pneumothorax or pleural
effusion.

Low lung volumes.  Linear interstitial opacities bilaterally.

No displaced fracture.
IMPRESSION: Chronic lung changes without evidence of acute cardiopulmonary
disease

## 2020-10-14 ENCOUNTER — Other Ambulatory Visit: Payer: BLUE CROSS/BLUE SHIELD

## 2020-10-14 DIAGNOSIS — Z20822 Contact with and (suspected) exposure to covid-19: Secondary | ICD-10-CM

## 2020-10-16 LAB — SARS-COV-2, NAA 2 DAY TAT

## 2020-10-16 LAB — NOVEL CORONAVIRUS, NAA: SARS-CoV-2, NAA: NOT DETECTED

## 2022-12-28 ENCOUNTER — Emergency Department: Payer: Managed Care, Other (non HMO)

## 2022-12-28 ENCOUNTER — Encounter: Payer: Self-pay | Admitting: Radiology

## 2022-12-28 ENCOUNTER — Emergency Department
Admission: EM | Admit: 2022-12-28 | Discharge: 2022-12-29 | Disposition: A | Discharge status: Home / Self Care | Payer: Managed Care, Other (non HMO) | Attending: Emergency Medicine | Admitting: Emergency Medicine

## 2022-12-28 DIAGNOSIS — I10 Essential (primary) hypertension: Secondary | ICD-10-CM | POA: Diagnosis not present

## 2022-12-28 DIAGNOSIS — K30 Functional dyspepsia: Secondary | ICD-10-CM | POA: Insufficient documentation

## 2022-12-28 LAB — CBC
HCT: 41.3 % (ref 39.0–52.0)
Hemoglobin: 14.5 g/dL (ref 13.0–17.0)
MCH: 32.4 pg (ref 26.0–34.0)
MCHC: 35.1 g/dL (ref 30.0–36.0)
MCV: 92.4 fL (ref 80.0–100.0)
Platelets: 168 10*3/uL (ref 150–400)
RBC: 4.47 MIL/uL (ref 4.22–5.81)
RDW: 11.2 % — ABNORMAL LOW (ref 11.5–15.5)
WBC: 4.2 10*3/uL (ref 4.0–10.5)
nRBC: 0 % (ref 0.0–0.2)

## 2022-12-28 LAB — BASIC METABOLIC PANEL
Anion gap: 8 (ref 5–15)
BUN: 14 mg/dL (ref 8–23)
CO2: 21 mmol/L — ABNORMAL LOW (ref 22–32)
Calcium: 8.4 mg/dL — ABNORMAL LOW (ref 8.9–10.3)
Chloride: 104 mmol/L (ref 98–111)
Creatinine, Ser: 0.82 mg/dL (ref 0.61–1.24)
GFR, Estimated: >60 mL/min (ref >60)
Glucose, Bld: 124 mg/dL — ABNORMAL HIGH (ref 70–99)
Potassium: 3.7 mmol/L (ref 3.5–5.1)
Sodium: 133 mmol/L — ABNORMAL LOW (ref 135–145)

## 2022-12-28 LAB — TROPONIN I (HIGH SENSITIVITY): Troponin I (High Sensitivity): 6 ng/L (ref <18)

## 2022-12-28 NOTE — ED Notes (Signed)
Provider bedside.

## 2022-12-28 NOTE — ED Notes (Signed)
Pt ambulated to the bathroom down the hall w/o any issues

## 2022-12-28 NOTE — ED Triage Notes (Signed)
Pt to ED via [facility] from home. Per EMS pt stopped taking Lisinopril 4 months ago and restarted 2 weeks ago. Per EMS upon arrival 220/120 and HR 115. Per EMS 200cc's NS given en route, 170/100 and HR 85 after fluid bolus. Per EMS pt also with new onset RBBB noted on EKG. Per EMS pt started/stopped his BP meds without direction of physician.

## 2022-12-28 NOTE — ED Provider Notes (Signed)
[facility] Provider Note    Event Date/Time   First MD Initiated Contact with Patient 12/28/22 2204     (approximate)   History   No chief complaint on file.   HPI  Eric Holland is a 72 y.o. male   Past medical history of history of hypertension who presents emergency department with hypertension and indigestion.  He was in his regular state of health today when he had lunch and subsequently felt some indigestion and nausea and had 1 episode of diarrhea which has now resolved.  He is feeling asymptomatic now.  However during this episode he took his blood pressure which was elevated.  Given the elevated blood pressure came to the emergency department for check.  Of note, he self discontinued his antihypertensives during this past year but only recently restarted them approximately 3 weeks ago.  He currently feels well, no longer has indigestion, nausea, any discomfort whatsoever.  He denies chest pain shortness of breath headache vision changes.  Independent Historian contributed to assessment above: His wife who is at bedside  External Medical Documents Reviewed: Family medicine visit dated December 08, 2022 where they discussed his hypertension and noted that his blood pressure was well-controlled in the [id number] to [id number] systolic range and continued with lisinopril 10 mg daily      Physical Exam   Triage Vital Signs: ED Triage Vitals [12/28/22 2110]  Enc Vitals Group     BP (!) 188/110     Pulse Rate 80     Resp 18     Temp 98 F (36.7 C)     Temp Source Oral     SpO2 98 %     Weight 241 lb (109.3 kg)     Height 6' (1.829 m)     Head Circumference      Peak Flow      Pain Score      Pain Loc      Pain Edu?      Excl. in [place]?     Most recent vital signs: Vitals:   12/28/22 2221 12/28/22 2315  BP: (!) 174/93 (!) 161/97  Pulse: 75 70  Resp: 15 17  Temp:    SpO2: 96% 96%    General: Awake, no distress.  CV:  Good peripheral perfusion.   Resp:  Normal effort.  Abd:  No distention.  Other:  Awake alert comfortable appearing hypertensive 160/90, skin appears warm well-perfused mentation is normal.  Abdomen soft and nontender deep palpation all quadrants.  Radial pulses equal bilaterally.  Lungs clear to auscultation.  No facial asymmetry dysarthria or motor or sensory deficits.  Gait is stable.   ED Results / Procedures / Treatments   Labs (all labs ordered are listed, but only abnormal results are displayed) Labs Reviewed  BASIC METABOLIC PANEL - Abnormal; Notable for the following components:      Result Value   Sodium 133 (*)    CO2 21 (*)    Glucose, Bld 124 (*)    Calcium 8.4 (*)    All other components within normal limits  CBC - Abnormal; Notable for the following components:   RDW 11.2 (*)    All other components within normal limits  TROPONIN I (HIGH SENSITIVITY)  TROPONIN I (HIGH SENSITIVITY)     I ordered and reviewed the above labs they are notable for troponin initially is 6   EKG  ED ECG REPORT I, Lucillie Garfinkel, the attending physician, personally viewed  and interpreted this ECG.   Date: 12/28/2022  EKG Time: 2110  Rate: 79  Rhythm: NSR  Axis: nl  Intervals: Nonspecific intraventricular conduction block  ST&T Change: no acute ishcemic changes    RADIOLOGY I independently reviewed and interpreted chest x-ray see no obvious focality or pneumothorax   PROCEDURES:  Critical Care performed: No  Procedures   MEDICATIONS ORDERED IN ED: Medications - No data to display   IMPRESSION / MDM / [place] / ED COURSE  I reviewed the triage vital signs and the nursing notes.                                Patient's presentation is most consistent with acute presentation with potential threat to life or bodily function.  Differential diagnosis includes, but is not limited to, hypertensive crisis, endorgan dysfunction like ACS, kidney failure, CVA, considered but less likely  intra-abdominal infection, obstruction   The patient is on the cardiac monitor to evaluate for evidence of arrhythmia and/or significant heart rate changes.  MDM: Patient with hypertension with no symptoms currently.   Troponin flat, EKG is nonischemic.  No signs of focal neurologic deficits to suggest CVA.  Creatinine is normal.  For his hypertension he should continue his antihypertensives and follow-up with his primary doctor to discuss further management.  Some indigestion after eating a meal that resolved with 1 episode of loose stool, nausea but no vomiting.  Benign abdomen and no symptoms currently so I doubt he is having intra-abdominal infection or cholecystitis.  I considered hospitalization for admission or observation however given complete resolution of the symptoms at this time and stable in the emergency department and no evidence of endorgan dysfunction on testing today I think outpatient monitoring follow-up is most appropriate at this time.        FINAL CLINICAL IMPRESSION(S) / ED DIAGNOSES   Final diagnoses:  Uncontrolled hypertension     Rx / DC Orders   ED Discharge Orders     None        Note:  This document was prepared using Dragon voice recognition software and may include unintentional dictation errors.    Lucillie Garfinkel, MD 12/29/22 [phone]

## 2022-12-29 LAB — TROPONIN I (HIGH SENSITIVITY): Troponin I (High Sensitivity): 7 ng/L (ref <18)

## 2022-12-29 NOTE — Discharge Instructions (Signed)
Recorded blood pressures daily for the next several days and talk to your primary doctor about adjusting your blood pressure medications as needed.  In terms of your indigestion and upset stomach today, the symptoms have now passed and so I do not think that you are having an emergency like an infection that would need surgery, however if you continue to have the symptoms or they become severe or recurrent, come back to the emergency department for recheck.  Thank you for choosing [place] for your health care today!  Please see your primary doctor this week for a follow up appointment.   Sometimes, in the early stages of certain disease courses it is difficult to detect in the emergency department evaluation -- so, it is important that you continue to monitor your symptoms and call your doctor right away or return to the emergency department if you develop any new or worsening symptoms.  Please go to the following website to schedule new (and existing) patient appointments:   [email or web address]  If you do not have a primary doctor try calling the following clinics to establish care:  If you have insurance:  [facility] [phone] [place] [address]   [facility]  [phone] [address] [place] [address]   If you do not have insurance:  [facility]  [phone] [address] [place] [address]   The following is another list of primary care offices in the area who are accepting new patients at this time.  Please reach out to one of them directly and let them know you would like to schedule an appointment to follow up on an Emergency Department visit, and/or to establish a new primary care provider (PCP).  There are likely other primary care clinics in the are who are accepting new patients, but this is an excellent place to start:  [place] physician: Dr  Lavon Paganini [address] [place], [place] [address] [phone]  [facility] Lead Physician: Dr Steele Sizer [address], [place], [place] [address] [phone]  [place] Physician: Dr Park Liter [address] [place], [place] [address] [phone]  [facility] Lead Physician: Dr Dewaine Oats [place], [place], [place] [address] [phone]  [place] [facility] [employer] Physician: Dr Halina Maidens [address] [place] [place], [place] [address] [phone]   It was my pleasure to care for you today.   [place] Brunette Jacelyn Grip, MD

## 2022-12-29 NOTE — ED Provider Notes (Incomplete)
[facility] Provider Note    Event Date/Time   First MD Initiated Contact with Patient 12/28/22 2204     (approximate)   History   No chief complaint on file.   HPI  Eric Holland is a 72 y.o. male   Past medical history of history of hypertension who presents emergency department with hypertension and indigestion.  He was in his regular state of health today when he had lunch and subsequently felt some indigestion and nausea and had 1 episode of diarrhea which has now resolved.  He is feeling asymptomatic now.  However during this episode he took his blood pressure which was elevated.  Given the elevated blood pressure came to the emergency department for check.  Of note, he self discontinued his antihypertensives during this past year but only recently restarted them approximately 3 weeks ago.  He currently feels well, no longer has indigestion, nausea, any discomfort whatsoever.  He denies chest pain shortness of breath headache vision changes.  Independent Historian contributed to assessment above: His wife who is at bedside  External Medical Documents Reviewed: Family medicine visit dated December 08, 2022 where they discussed his hypertension and noted that his blood pressure was well-controlled in the [id number] to [id number] systolic range and continued with lisinopril 10 mg daily      Physical Exam   Triage Vital Signs: ED Triage Vitals [12/28/22 2110]  Enc Vitals Group     BP (!) 188/110     Pulse Rate 80     Resp 18     Temp 98 F (36.7 C)     Temp Source Oral     SpO2 98 %     Weight 241 lb (109.3 kg)     Height 6' (1.829 m)     Head Circumference      Peak Flow      Pain Score      Pain Loc      Pain Edu?      Excl. in [place]?     Most recent vital signs: Vitals:   12/28/22 2221 12/28/22 2315  BP: (!) 174/93 (!) 161/97  Pulse: 75 70  Resp: 15 17  Temp:    SpO2: 96% 96%    General: Awake, no distress.  CV:  Good peripheral perfusion.   Resp:  Normal effort.  Abd:  No distention.  Other:  Awake alert comfortable appearing hypertensive 160/90, skin appears warm well-perfused mentation is normal.  Abdomen soft and nontender deep palpation all quadrants.  Radial pulses equal bilaterally.  Lungs clear to auscultation.  No facial asymmetry dysarthria or motor or sensory deficits.  Gait is stable.   ED Results / Procedures / Treatments   Labs (all labs ordered are listed, but only abnormal results are displayed) Labs Reviewed  BASIC METABOLIC PANEL - Abnormal; Notable for the following components:      Result Value   Sodium 133 (*)    CO2 21 (*)    Glucose, Bld 124 (*)    Calcium 8.4 (*)    All other components within normal limits  CBC - Abnormal; Notable for the following components:   RDW 11.2 (*)    All other components within normal limits  TROPONIN I (HIGH SENSITIVITY)  TROPONIN I (HIGH SENSITIVITY)     I ordered and reviewed the above labs they are notable for troponin initially is 6   EKG  ED ECG REPORT I, Lucillie Garfinkel, the attending physician, personally viewed  and interpreted this ECG.   Date: 12/28/2022  EKG Time: 2110  Rate: 79  Rhythm: NSR  Axis: nl  Intervals: Nonspecific intraventricular conduction block  ST&T Change: no acute ishcemic changes    RADIOLOGY I independently reviewed and interpreted chest x-ray see no obvious focality or pneumothorax   PROCEDURES:  Critical Care performed: No  Procedures   MEDICATIONS ORDERED IN ED: Medications - No data to display   IMPRESSION / MDM / [place] / ED COURSE  I reviewed the triage vital signs and the nursing notes.                                Patient's presentation is most consistent with acute presentation with potential threat to life or bodily function.  Differential diagnosis includes, but is not limited to, hypertensive crisis, endorgan dysfunction like ACS, kidney failure, CVA, considered but less likely  intra-abdominal infection, obstruction   The patient is on the cardiac monitor to evaluate for evidence of arrhythmia and/or significant heart rate changes.  MDM: Patient with hypertension with no symptoms currently.   Troponin flat, EKG is nonischemic.  No signs of focal neurologic deficits to suggest CVA.  Creatinine is normal.  For his hypertension he should continue his antihypertensives and follow-up with his primary doctor to discuss further management.  Some indigestion after eating a meal that resolved with 1 episode of loose stool, nausea but no vomiting.  Benign abdomen and no symptoms currently so I doubt he is having intra-abdominal infection or cholecystitis.  I considered hospitalization for admission or observation however given complete resolution of the symptoms at this time and stable in the emergency department and no evidence of endorgan dysfunction on testing today I think outpatient monitoring follow-up is most appropriate at this time.        FINAL CLINICAL IMPRESSION(S) / ED DIAGNOSES   Final diagnoses:  None     Rx / DC Orders   ED Discharge Orders     None        Note:  This document was prepared using Dragon voice recognition software and may include unintentional dictation errors.
